# Patient Record
Sex: Male | Born: 1995 | Race: White | Hispanic: Yes | Marital: Single | Smoking: Current every day smoker
Health system: Southern US, Community
[De-identification: ages and names within clinical notes are randomized; demographics above are authoritative.]

## PROBLEM LIST (undated history)

## (undated) DIAGNOSIS — G43909 Migraine, unspecified, not intractable, without status migrainosus: Secondary | ICD-10-CM

## (undated) HISTORY — PX: OTHER SURGICAL HISTORY: SHX169

---
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
## 2017-01-28 ENCOUNTER — Emergency Department: Payer: Medicaid Other

## 2017-01-28 ENCOUNTER — Emergency Department
Admission: EM | Admit: 2017-01-28 | Discharge: 2017-01-28 | Disposition: A | Discharge status: Home / Self Care | Payer: Medicaid Other | Attending: Student in an Organized Health Care Education/Training Program | Admitting: Student in an Organized Health Care Education/Training Program

## 2017-01-28 DIAGNOSIS — R1032 Left lower quadrant pain: Secondary | ICD-10-CM | POA: Diagnosis present

## 2017-01-28 DIAGNOSIS — F17210 Nicotine dependence, cigarettes, uncomplicated: Secondary | ICD-10-CM | POA: Insufficient documentation

## 2017-01-28 DIAGNOSIS — N201 Calculus of ureter: Secondary | ICD-10-CM

## 2017-01-28 DIAGNOSIS — Z0389 Encounter for observation for other suspected diseases and conditions ruled out: Secondary | ICD-10-CM | POA: Insufficient documentation

## 2017-01-28 DIAGNOSIS — N50812 Left testicular pain: Secondary | ICD-10-CM

## 2017-01-28 LAB — CBC WITH DIFFERENTIAL/PLATELET
BASOS ABS: 0 10*3/uL (ref 0–0.1)
Basophils Relative: 1 %
Eosinophils Absolute: 0.1 10*3/uL (ref 0–0.7)
Eosinophils Relative: 1 %
HCT: 43.5 % (ref 40.0–52.0)
Hemoglobin: 15.3 g/dL (ref 13.0–18.0)
LYMPHS ABS: 1 10*3/uL (ref 1.0–3.6)
Lymphocytes Relative: 11 %
MCH: 31.6 pg (ref 26.0–34.0)
MCHC: 35.1 g/dL (ref 32.0–36.0)
MCV: 90.1 fL (ref 80.0–100.0)
MONOS PCT: 7 %
Monocytes Absolute: 0.6 10*3/uL (ref 0.2–1.0)
Neutro Abs: 7 10*3/uL — ABNORMAL HIGH (ref 1.4–6.5)
Neutrophils Relative %: 80 %
Platelets: 225 10*3/uL (ref 150–440)
RBC: 4.83 MIL/uL (ref 4.40–5.90)
RDW: 13.2 % (ref 11.5–14.5)
WBC: 8.7 10*3/uL (ref 3.8–10.6)

## 2017-01-28 LAB — URINE DRUG SCREEN, QUALITATIVE (ARMC ONLY)
Amphetamines, Ur Screen: NOT DETECTED
BARBITURATES, UR SCREEN: NOT DETECTED
BENZODIAZEPINE, UR SCRN: POSITIVE — AB
Cannabinoid 50 Ng, Ur ~~LOC~~: POSITIVE — AB
Cocaine Metabolite,Ur ~~LOC~~: NOT DETECTED
MDMA (Ecstasy)Ur Screen: NOT DETECTED
METHADONE SCREEN, URINE: NOT DETECTED
Opiate, Ur Screen: NOT DETECTED
Phencyclidine (PCP) Ur S: NOT DETECTED
TRICYCLIC, UR SCREEN: NOT DETECTED

## 2017-01-28 LAB — URINALYSIS, COMPLETE (UACMP) WITH MICROSCOPIC
BACTERIA UA: NONE SEEN
BILIRUBIN URINE: NEGATIVE
Glucose, UA: NEGATIVE mg/dL
KETONES UR: 20 mg/dL — AB
LEUKOCYTES UA: NEGATIVE
Nitrite: NEGATIVE
Protein, ur: 30 mg/dL — AB
SPECIFIC GRAVITY, URINE: 1.025 (ref 1.005–1.030)
SQUAMOUS EPITHELIAL / LPF: NONE SEEN
pH: 6 (ref 5.0–8.0)

## 2017-01-28 LAB — BASIC METABOLIC PANEL
ANION GAP: 8 (ref 5–15)
BUN: 11 mg/dL (ref 6–20)
CALCIUM: 8.4 mg/dL — AB (ref 8.9–10.3)
CO2: 25 mmol/L (ref 22–32)
Chloride: 108 mmol/L (ref 101–111)
Creatinine, Ser: 0.85 mg/dL (ref 0.61–1.24)
GFR calc Af Amer: >60 mL/min (ref >60)
GFR calc non Af Amer: >60 mL/min (ref >60)
GLUCOSE: 103 mg/dL — AB (ref 65–99)
Potassium: 3.6 mmol/L (ref 3.5–5.1)
Sodium: 141 mmol/L (ref 135–145)

## 2017-01-28 MED ORDER — MORPHINE SULFATE (PF) 4 MG/ML IV SOLN: 4.0000 mg | INTRAVENOUS | Status: DC | PRN

## 2017-01-28 MED ORDER — HYDROCODONE-ACETAMINOPHEN 5-325 MG PO TABS: 1.0000 | ORAL_TABLET | ORAL | 0 refills | Status: DC | PRN

## 2017-01-28 MED ORDER — HALOPERIDOL LACTATE 5 MG/ML IJ SOLN
5.0000 mg | Freq: Once | INTRAMUSCULAR | Status: AC
  Administered 2017-01-28: 5 mg via INTRAVENOUS
  Filled 2017-01-28: qty 1

## 2017-01-28 MED ORDER — NAPROXEN 500 MG PO TABS: 500.0000 mg | ORAL_TABLET | Freq: Two times a day (BID) | ORAL | 0 refills | Status: DC

## 2017-01-28 MED ORDER — PROMETHAZINE HCL 12.5 MG PO TABS: 12.5000 mg | ORAL_TABLET | Freq: Four times a day (QID) | ORAL | 0 refills | Status: DC | PRN

## 2017-01-28 MED ORDER — KETOROLAC TROMETHAMINE 30 MG/ML IJ SOLN
15.0000 mg | Freq: Once | INTRAMUSCULAR | Status: AC
  Administered 2017-01-28: 15 mg via INTRAVENOUS
  Filled 2017-01-28: qty 1

## 2017-01-28 MED ORDER — HYDROCODONE-ACETAMINOPHEN 5-325 MG PO TABS
1.0000 | ORAL_TABLET | Freq: Once | ORAL | Status: AC
  Administered 2017-01-28: 1 via ORAL
  Filled 2017-01-28: qty 1

## 2017-01-28 NOTE — ED Notes (Signed)
Pt unable to void at this time. 

## 2017-01-28 NOTE — ED Triage Notes (Addendum)
Pt arrived via ems for c/o groin/testicular pain - pt reports that he was asleep and awoke with severe pain in his left groin/testicle area - denies difficulty with urination or frequency

## 2017-01-28 NOTE — ED Notes (Signed)
Patient transported to Ultrasound 

## 2017-01-28 NOTE — ED Provider Notes (Signed)
[facility] Emergency Department Provider Note    First MD Initiated Contact with Patient 01/28/17 1233     (approximate)  I have reviewed the triage vital signs and the nursing notes.   HISTORY  Chief Complaint Groin Pain    HPI Mike Cochran is a 21 y.o. male severe left groin and testicular pain that awoke the patient from sleep. Rates the pain as 10 out of 10 in severity. Denies any dysuria or hematuria. He denies any trauma. Is not currently sexually active. No discharge.  Eyes any diarrhea or nausea or vomiting.   Past Medical History:  Diagnosis Date  . Migraine    No family history on file. Past Surgical History:  Procedure Laterality Date  . arm surgery     for fracture   There are no active problems to display for this patient.     Prior to Admission medications   Medication Sig Start Date End Date Taking? Authorizing Provider  HYDROcodone-acetaminophen (NORCO) 5-325 MG tablet Take 1 tablet by mouth every 4 (four) hours as needed for moderate pain. 01/28/17   Willy Eddy, MD  ibuprofen (ADVIL,MOTRIN) 800 MG tablet Take 1 tablet (800 mg total) by mouth every 8 (eight) hours as needed. Patient not taking: Reported on 01/28/2017 11/27/15   Evangeline Dakin, PA-C  naproxen (NAPROSYN) 500 MG tablet Take 1 tablet (500 mg total) by mouth 2 (two) times daily with a meal. 01/28/17 01/28/18  Willy Eddy, MD  promethazine (PHENERGAN) 12.5 MG tablet Take 1 tablet (12.5 mg total) by mouth every 6 (six) hours as needed for nausea or vomiting. 01/28/17   Willy Eddy, MD    Allergies Patient has no known allergies.    Social History Social History  Substance Use Topics  . Smoking status: Current Every Day Smoker    Packs/day: 0.25    Types: Cigarettes  . Smokeless tobacco: Never Used  . Alcohol use Yes    Review of Systems Patient denies headaches, rhinorrhea, blurry vision, numbness, shortness of breath, chest pain, edema,  cough, abdominal pain, nausea, vomiting, diarrhea, dysuria, fevers, rashes or hallucinations unless otherwise stated above in HPI. ____________________________________________   PHYSICAL EXAM:  VITAL SIGNS: Vitals:   01/28/17 1330 01/28/17 1405  BP: 114/60 104/66  Pulse: 61 (!) 52  Resp:    Temp:    SpO2: 99% 100%    Constitutional: Alert and oriented. Well appearing and in no acute distress. Eyes: Conjunctivae are normal.  Head: Atraumatic. Nose: No congestion/rhinnorhea. Mouth/Throat: Mucous membranes are moist.   Neck: No stridor. Painless ROM.  Cardiovascular: Normal rate, regular rhythm. Grossly normal heart sounds.  Good peripheral circulation. Respiratory: Normal respiratory effort.  No retractions. Lungs CTAB. Gastrointestinal: Soft and nontender. No distention. No abdominal bruits. No CVA tenderness. Genitourinary: no scrotal mass, or tenderness, no hernia, + cremasteric reflex bilaterally Musculoskeletal: No lower extremity tenderness nor edema.  No joint effusions. Neurologic:  Normal speech and language. No gross focal neurologic deficits are appreciated. No facial droop Skin:  Skin is warm, dry and intact. No rash noted. Psychiatric: Mood and affect are normal. Speech and behavior are normal.  ____________________________________________   LABS (all labs ordered are listed, but only abnormal results are displayed)  Results for orders placed or performed during the hospital encounter of 01/28/17 (from the past 24 hour(s))  CBC with Differential/Platelet     Status: Abnormal   Collection Time: 01/28/17 12:55 PM  Result Value Ref Range   WBC 8.7  3.8 - 10.6 K/uL   RBC 4.83 4.40 - 5.90 MIL/uL   Hemoglobin 15.3 13.0 - 18.0 g/dL   HCT 45.4 09.8 - 11.9 %   MCV 90.1 80.0 - 100.0 fL   MCH 31.6 26.0 - 34.0 pg   MCHC 35.1 32.0 - 36.0 g/dL   RDW 14.7 82.9 - 56.2 %   Platelets 225 150 - 440 K/uL   Neutrophils Relative % 80 %   Neutro Abs 7.0 (H) 1.4 - 6.5 K/uL    Lymphocytes Relative 11 %   Lymphs Abs 1.0 1.0 - 3.6 K/uL   Monocytes Relative 7 %   Monocytes Absolute 0.6 0.2 - 1.0 K/uL   Eosinophils Relative 1 %   Eosinophils Absolute 0.1 0 - 0.7 K/uL   Basophils Relative 1 %   Basophils Absolute 0.0 0 - 0.1 K/uL  Basic metabolic panel     Status: Abnormal   Collection Time: 01/28/17 12:55 PM  Result Value Ref Range   Sodium 141 135 - 145 mmol/L   Potassium 3.6 3.5 - 5.1 mmol/L   Chloride 108 101 - 111 mmol/L   CO2 25 22 - 32 mmol/L   Glucose, Bld 103 (H) 65 - 99 mg/dL   BUN 11 6 - 20 mg/dL   Creatinine, Ser 1.30 0.61 - 1.24 mg/dL   Calcium 8.4 (L) 8.9 - 10.3 mg/dL   GFR calc non Af Amer >60 >60 mL/min   GFR calc Af Amer >60 >60 mL/min   Anion gap 8 5 - 15  Urinalysis, Complete w Microscopic     Status: Abnormal   Collection Time: 01/28/17  2:40 PM  Result Value Ref Range   Color, Urine AMBER (A) YELLOW   APPearance CLEAR (A) CLEAR   Specific Gravity, Urine 1.025 1.005 - 1.030   pH 6.0 5.0 - 8.0   Glucose, UA NEGATIVE NEGATIVE mg/dL   Hgb urine dipstick LARGE (A) NEGATIVE   Bilirubin Urine NEGATIVE NEGATIVE   Ketones, ur 20 (A) NEGATIVE mg/dL   Protein, ur 30 (A) NEGATIVE mg/dL   Nitrite NEGATIVE NEGATIVE   Leukocytes, UA NEGATIVE NEGATIVE   RBC / HPF TOO NUMEROUS TO COUNT 0 - 5 RBC/hpf   WBC, UA 6-30 0 - 5 WBC/hpf   Bacteria, UA NONE SEEN NONE SEEN   Squamous Epithelial / LPF NONE SEEN NONE SEEN   Mucus PRESENT    ____________________________________________ ____________________________________________  RADIOLOGY  I personally reviewed all radiographic images ordered to evaluate for the above acute complaints and reviewed radiology reports and findings.  These findings were personally discussed with the patient.  Please see medical record for radiology report.  ____________________________________________   PROCEDURES  Procedure(s) performed:  Procedures    Critical Care performed:  no ____________________________________________   INITIAL IMPRESSION / ASSESSMENT AND PLAN / ED COURSE  Pertinent labs & imaging results that were available during my care of the patient were reviewed by me and considered in my medical decision making (see chart for details).  DDX: torsion, epididymitis, stone, uti, hernia  Mike Cochran is a 21 y.o. who presents to the ED with left groin pain as described above. Ultrasound ordered due to concern for epididymitis versus torsion though clinically seemed less consistent with this. Ultrasound shows good blood flow bilaterally with normal findings therefore his CT imaging ordered for the above differential shows evidence of nonobstructing stone. Patient does have hematuria without infection. No leukocytosis and renal function is normal.  Clinical Course as of Jan 29 1551  Tue Jan 28, 2017  1517 urinalysis does show large blood and no infection. Presentation most clinically consistent with ureterolithiasis. No evidence of torsion or epididymitis. No evidence of perforation. We'll give IV Toradol and drove oral pain medication.  [PR]  1546 patient reassessed. Resting comfortably. Repeat abdominal exam is soft and benign. This point if the patient is stable for outpatient follow-up. Will be given referral to urology.  [PR]    Clinical Course User Index [PR] Willy Eddyobinson, Julianne Chamberlin, MD     ____________________________________________   FINAL CLINICAL IMPRESSION(S) / ED DIAGNOSES  Final diagnoses:  Testicular pain, left  Left groin pain  Ureterolithiasis      NEW MEDICATIONS STARTED DURING THIS VISIT:  New Prescriptions   HYDROCODONE-ACETAMINOPHEN (NORCO) 5-325 MG TABLET    Take 1 tablet by mouth every 4 (four) hours as needed for moderate pain.   NAPROXEN (NAPROSYN) 500 MG TABLET    Take 1 tablet (500 mg total) by mouth 2 (two) times daily with a meal.   PROMETHAZINE (PHENERGAN) 12.5 MG TABLET    Take 1 tablet (12.5 mg total) by mouth  every 6 (six) hours as needed for nausea or vomiting.     Note:  This document was prepared using Dragon voice recognition software and may include unintentional dictation errors.    Willy Eddyobinson, Shaniah Baltes, MD 01/28/17 [phone]

## 2017-01-28 NOTE — ED Notes (Addendum)
Pt appears anxious and pressured speech - he is hesitant to be discharged due to the pain and not knowing where to go for f/u - advised pt that this nurse would review discharge instructions in detail with him prior to discharge and that pain would be controlled at time of discharge

## 2017-01-28 NOTE — Discharge Instructions (Signed)

## 2017-10-29 ENCOUNTER — Other Ambulatory Visit: Payer: Self-pay

## 2017-10-29 ENCOUNTER — Emergency Department
Admission: EM | Admit: 2017-10-29 | Discharge: 2017-10-29 | Disposition: A | Discharge status: Other Acute Inpatient Hospital | Payer: Medicaid Other | Attending: Emergency Medicine | Admitting: Emergency Medicine

## 2017-10-29 ENCOUNTER — Emergency Department: Payer: Medicaid Other

## 2017-10-29 ENCOUNTER — Encounter: Payer: Self-pay | Admitting: Psychiatry

## 2017-10-29 ENCOUNTER — Inpatient Hospital Stay
Admission: AD | Admit: 2017-10-29 | Discharge: 2017-10-31 | DRG: 885 | Disposition: A | Discharge status: Home / Self Care | Payer: Medicaid Other | Attending: Psychiatry | Admitting: Psychiatry

## 2017-10-29 DIAGNOSIS — Z818 Family history of other mental and behavioral disorders: Secondary | ICD-10-CM | POA: Diagnosis not present

## 2017-10-29 DIAGNOSIS — S71102A Unspecified open wound, left thigh, initial encounter: Secondary | ICD-10-CM | POA: Diagnosis present

## 2017-10-29 DIAGNOSIS — Z7289 Other problems related to lifestyle: Secondary | ICD-10-CM | POA: Insufficient documentation

## 2017-10-29 DIAGNOSIS — Y92410 Unspecified street and highway as the place of occurrence of the external cause: Secondary | ICD-10-CM

## 2017-10-29 DIAGNOSIS — F1220 Cannabis dependence, uncomplicated: Secondary | ICD-10-CM

## 2017-10-29 DIAGNOSIS — Y901 Blood alcohol level of 20-39 mg/100 ml: Secondary | ICD-10-CM | POA: Insufficient documentation

## 2017-10-29 DIAGNOSIS — Y929 Unspecified place or not applicable: Secondary | ICD-10-CM | POA: Diagnosis not present

## 2017-10-29 DIAGNOSIS — X749XXA Intentional self-harm by unspecified firearm discharge, initial encounter: Secondary | ICD-10-CM

## 2017-10-29 DIAGNOSIS — F10920 Alcohol use, unspecified with intoxication, uncomplicated: Secondary | ICD-10-CM | POA: Insufficient documentation

## 2017-10-29 DIAGNOSIS — Y999 Unspecified external cause status: Secondary | ICD-10-CM | POA: Insufficient documentation

## 2017-10-29 DIAGNOSIS — Z915 Personal history of self-harm: Secondary | ICD-10-CM | POA: Diagnosis not present

## 2017-10-29 DIAGNOSIS — Z046 Encounter for general psychiatric examination, requested by authority: Secondary | ICD-10-CM | POA: Diagnosis not present

## 2017-10-29 DIAGNOSIS — F17210 Nicotine dependence, cigarettes, uncomplicated: Secondary | ICD-10-CM | POA: Diagnosis not present

## 2017-10-29 DIAGNOSIS — Z733 Stress, not elsewhere classified: Secondary | ICD-10-CM | POA: Insufficient documentation

## 2017-10-29 DIAGNOSIS — F332 Major depressive disorder, recurrent severe without psychotic features: Principal | ICD-10-CM | POA: Diagnosis present

## 2017-10-29 DIAGNOSIS — F17200 Nicotine dependence, unspecified, uncomplicated: Secondary | ICD-10-CM | POA: Diagnosis present

## 2017-10-29 DIAGNOSIS — R1110 Vomiting, unspecified: Secondary | ICD-10-CM | POA: Insufficient documentation

## 2017-10-29 DIAGNOSIS — X72XXXA Intentional self-harm by handgun discharge, initial encounter: Secondary | ICD-10-CM | POA: Insufficient documentation

## 2017-10-29 DIAGNOSIS — F329 Major depressive disorder, single episode, unspecified: Secondary | ICD-10-CM | POA: Diagnosis not present

## 2017-10-29 DIAGNOSIS — Z23 Encounter for immunization: Secondary | ICD-10-CM | POA: Insufficient documentation

## 2017-10-29 DIAGNOSIS — Y249XXA Unspecified firearm discharge, undetermined intent, initial encounter: Secondary | ICD-10-CM

## 2017-10-29 DIAGNOSIS — Y9389 Activity, other specified: Secondary | ICD-10-CM | POA: Diagnosis not present

## 2017-10-29 DIAGNOSIS — Z9189 Other specified personal risk factors, not elsewhere classified: Secondary | ICD-10-CM | POA: Diagnosis not present

## 2017-10-29 DIAGNOSIS — S71132A Puncture wound without foreign body, left thigh, initial encounter: Secondary | ICD-10-CM | POA: Insufficient documentation

## 2017-10-29 LAB — CBC WITH DIFFERENTIAL/PLATELET
Basophils Absolute: 0.1 10*3/uL (ref 0–0.1)
Basophils Relative: 0 %
EOS ABS: 0.1 10*3/uL (ref 0–0.7)
EOS PCT: 1 %
HCT: 45.8 % (ref 40.0–52.0)
HEMOGLOBIN: 16.1 g/dL (ref 13.0–18.0)
LYMPHS ABS: 4.5 10*3/uL — AB (ref 1.0–3.6)
Lymphocytes Relative: 31 %
MCH: 32.4 pg (ref 26.0–34.0)
MCHC: 35 g/dL (ref 32.0–36.0)
MCV: 92.3 fL (ref 80.0–100.0)
Monocytes Absolute: 1.2 10*3/uL — ABNORMAL HIGH (ref 0.2–1.0)
Monocytes Relative: 8 %
Neutro Abs: 8.4 10*3/uL — ABNORMAL HIGH (ref 1.4–6.5)
Neutrophils Relative %: 60 %
PLATELETS: 332 10*3/uL (ref 150–440)
RBC: 4.96 MIL/uL (ref 4.40–5.90)
RDW: 13.7 % (ref 11.5–14.5)
WBC: 14.2 10*3/uL — AB (ref 3.8–10.6)

## 2017-10-29 LAB — BASIC METABOLIC PANEL
Anion gap: 15 (ref 5–15)
BUN: 15 mg/dL (ref 6–20)
CHLORIDE: 103 mmol/L (ref 101–111)
CO2: 22 mmol/L (ref 22–32)
Calcium: 9.6 mg/dL (ref 8.9–10.3)
Creatinine, Ser: 0.99 mg/dL (ref 0.61–1.24)
GFR calc Af Amer: >60 mL/min (ref >60)
GFR calc non Af Amer: >60 mL/min (ref >60)
GLUCOSE: 124 mg/dL — AB (ref 65–99)
POTASSIUM: 3.1 mmol/L — AB (ref 3.5–5.1)
SODIUM: 140 mmol/L (ref 135–145)

## 2017-10-29 LAB — CK: CK TOTAL: 59 U/L (ref 49–397)

## 2017-10-29 LAB — ETHANOL: ALCOHOL ETHYL (B): 35 mg/dL — AB (ref <10)

## 2017-10-29 LAB — URINE DRUG SCREEN, QUALITATIVE (ARMC ONLY)
AMPHETAMINES, UR SCREEN: NOT DETECTED
Barbiturates, Ur Screen: NOT DETECTED
Benzodiazepine, Ur Scrn: NOT DETECTED
CANNABINOID 50 NG, UR ~~LOC~~: POSITIVE — AB
Cocaine Metabolite,Ur ~~LOC~~: NOT DETECTED
MDMA (ECSTASY) UR SCREEN: NOT DETECTED
Methadone Scn, Ur: NOT DETECTED
OPIATE, UR SCREEN: NOT DETECTED
PHENCYCLIDINE (PCP) UR S: NOT DETECTED
Tricyclic, Ur Screen: NOT DETECTED

## 2017-10-29 LAB — TYPE AND SCREEN
ABO/RH(D): O POS
Antibody Screen: NEGATIVE

## 2017-10-29 LAB — GLUCOSE, CAPILLARY: GLUCOSE-CAPILLARY: 126 mg/dL — AB (ref 65–99)

## 2017-10-29 MED ORDER — FLUOXETINE HCL 20 MG PO CAPS
20.0000 mg | ORAL_CAPSULE | Freq: Every day | ORAL | Status: DC
  Administered 2017-10-30 – 2017-10-31 (×2): 20 mg via ORAL
  Filled 2017-10-29 (×2): qty 1

## 2017-10-29 MED ORDER — ASPIRIN EC 81 MG PO TBEC: 81.0000 mg | DELAYED_RELEASE_TABLET | Freq: Every day | ORAL | Status: DC

## 2017-10-29 MED ORDER — ACETAMINOPHEN 500 MG PO TABS: 500.0000 mg | ORAL_TABLET | ORAL | Status: DC | PRN

## 2017-10-29 MED ORDER — OXYCODONE-ACETAMINOPHEN 5-325 MG PO TABS
1.0000 | ORAL_TABLET | Freq: Four times a day (QID) | ORAL | Status: DC | PRN
  Administered 2017-10-29 – 2017-10-30 (×3): 1 via ORAL
  Filled 2017-10-29 (×3): qty 1

## 2017-10-29 MED ORDER — SODIUM CHLORIDE 0.9 % IV BOLUS
1000.0000 mL | Freq: Once | INTRAVENOUS | Status: AC
  Administered 2017-10-29: 1000 mL via INTRAVENOUS

## 2017-10-29 MED ORDER — CEPHALEXIN 500 MG PO CAPS: 500.0000 mg | ORAL_CAPSULE | Freq: Three times a day (TID) | ORAL | 0 refills | Status: DC

## 2017-10-29 MED ORDER — CEFAZOLIN SODIUM-DEXTROSE 1-4 GM/50ML-% IV SOLN
1.0000 g | Freq: Once | INTRAVENOUS | Status: AC
  Administered 2017-10-29: 1 g via INTRAVENOUS
  Filled 2017-10-29: qty 50

## 2017-10-29 MED ORDER — IOPAMIDOL (ISOVUE-370) INJECTION 76%
125.0000 mL | Freq: Once | INTRAVENOUS | Status: AC | PRN
  Administered 2017-10-29: 125 mL via INTRAVENOUS

## 2017-10-29 MED ORDER — ONDANSETRON HCL 4 MG/2ML IJ SOLN
INTRAMUSCULAR | Status: AC
  Filled 2017-10-29: qty 2

## 2017-10-29 MED ORDER — HYDROXYZINE HCL 50 MG PO TABS: 50.0000 mg | ORAL_TABLET | Freq: Three times a day (TID) | ORAL | Status: DC | PRN

## 2017-10-29 MED ORDER — TETANUS-DIPHTH-ACELL PERTUSSIS 5-2.5-18.5 LF-MCG/0.5 IM SUSP
0.5000 mL | Freq: Once | INTRAMUSCULAR | Status: AC
  Administered 2017-10-29: 0.5 mL via INTRAMUSCULAR
  Filled 2017-10-29: qty 0.5

## 2017-10-29 MED ORDER — CEPHALEXIN 500 MG PO CAPS
500.0000 mg | ORAL_CAPSULE | Freq: Three times a day (TID) | ORAL | Status: DC
  Administered 2017-10-29 (×2): 500 mg via ORAL
  Filled 2017-10-29 (×2): qty 1

## 2017-10-29 MED ORDER — ONDANSETRON HCL 4 MG/2ML IJ SOLN
4.0000 mg | Freq: Once | INTRAMUSCULAR | Status: AC
  Administered 2017-10-29: 4 mg via INTRAVENOUS
  Filled 2017-10-29: qty 2

## 2017-10-29 MED ORDER — ACETAMINOPHEN 325 MG PO TABS: 650.0000 mg | ORAL_TABLET | Freq: Four times a day (QID) | ORAL | Status: DC | PRN

## 2017-10-29 MED ORDER — FLUOXETINE HCL 20 MG PO CAPS
20.0000 mg | ORAL_CAPSULE | Freq: Every day | ORAL | Status: DC
  Administered 2017-10-29: 20 mg via ORAL
  Filled 2017-10-29: qty 1

## 2017-10-29 MED ORDER — CEPHALEXIN 500 MG PO CAPS
500.0000 mg | ORAL_CAPSULE | Freq: Three times a day (TID) | ORAL | Status: AC
  Administered 2017-10-29 – 2017-10-31 (×5): 500 mg via ORAL
  Filled 2017-10-29 (×5): qty 1

## 2017-10-29 MED ORDER — ALUM & MAG HYDROXIDE-SIMETH 200-200-20 MG/5ML PO SUSP: 30.0000 mL | ORAL | Status: DC | PRN

## 2017-10-29 MED ORDER — TRAZODONE HCL 100 MG PO TABS
100.0000 mg | ORAL_TABLET | Freq: Every evening | ORAL | Status: DC | PRN
  Administered 2017-10-30: 100 mg via ORAL
  Filled 2017-10-29: qty 1

## 2017-10-29 MED ORDER — OXYCODONE-ACETAMINOPHEN 5-325 MG PO TABS
1.0000 | ORAL_TABLET | Freq: Once | ORAL | Status: AC
  Administered 2017-10-29: 1 via ORAL
  Filled 2017-10-29: qty 1

## 2017-10-29 MED ORDER — MAGNESIUM HYDROXIDE 400 MG/5ML PO SUSP: 30.0000 mL | Freq: Every day | ORAL | Status: DC | PRN

## 2017-10-29 MED ORDER — NICOTINE 21 MG/24HR TD PT24
21.0000 mg | MEDICATED_PATCH | Freq: Every day | TRANSDERMAL | Status: DC
  Filled 2017-10-29: qty 1
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
## 2017-10-29 NOTE — Discharge Instructions (Addendum)
1.  Take antibiotic as prescribed (Keflex 500 mg 3 times daily for 7 days). 2.  Take a baby aspirin daily. 3.  Return to the ER for worsening symptoms, increased redness/swelling, purulent discharge from wound, fevers, thoughts of hurting yourself or others, or other concerns.

## 2017-10-29 NOTE — ED Notes (Addendum)
Pt's sister called to talk with patient at 1pm during phone hours.   Social work currently at bedside.

## 2017-10-29 NOTE — ED Notes (Signed)
Iver Nestleenny Seagroves (Mother) [phone]

## 2017-10-29 NOTE — BH Assessment (Signed)
[facility] by Dr. Toni Amendlapacs.  Attending Physician will be Dr. Jennet MaduroPucilowska.   Patient has been assigned to room 311, [facility] Nurse Lillette BoxerGwen F.   ER staff is aware of the admission:  Emilie, ER Sectary   Dr. Lenard LancePaduchowski, ER MD   Mertie ClauseJeanelle, Patient Access.
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
## 2017-10-29 NOTE — ED Notes (Signed)
Pt reports thoughts of hopelessness. States that he knows he needs help and that he needs to move out of the environment he is in and surround himself with different people. Reports a lot of stress in his life re: living situation, choice in friends and his baby on the way. States he can only ever talk to his "baby mama" about his problems and has never talked to a therapist. States that he is willing.  Pt would like to speak with social work re: his options.

## 2017-10-29 NOTE — [facility] Group Notes (Signed)
[facility] Group Notes:  (Nursing/MHT/Case Management/Adjunct)  Date:  10/29/2017  Time:  9:36 PM  Type of Therapy:  Group Therapy  Participation Level:  Active  Participation Quality:  Appropriate  Affect:  Appropriate  Cognitive:  Appropriate  Insight:  Appropriate  Engagement in Group:  Engaged  Modes of Intervention:  Discussion  Summary of Progress/Problems:  Mike Cochran Mike Cochran Mike Cochran 10/29/2017, 9:36 PM

## 2017-10-29 NOTE — ED Notes (Signed)
TTS at bedside. 

## 2017-10-29 NOTE — Clinical Social Work Note (Signed)
CSW received consult for "Pt requesting to speak with someone regarding his options going forward. Would like to move out of his current living situation." CSW met with patient at bedside. Patient and CSW discussed concerns about several challenges in patient's home environment including homelessness, lack of consistent work/finances, untreated mental health needs, lack of transportation, and his girlfriend is expecting a child. Patient assessed by psychiatrist-Dr. Weber Cooks and recommended for inpatient psych admission. CSW completed handoff. CSW signing off.   Oretha Ellis, Latanya Presser, [place] Worker-Emergency Department [phone]
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
## 2017-10-30 DIAGNOSIS — F332 Major depressive disorder, recurrent severe without psychotic features: Principal | ICD-10-CM

## 2017-10-30 LAB — LIPID PANEL
CHOLESTEROL: 146 mg/dL (ref 0–200)
HDL: 44 mg/dL (ref >40)
LDL Cholesterol: 81 mg/dL (ref 0–99)
TRIGLYCERIDES: 105 mg/dL (ref <150)
Total CHOL/HDL Ratio: 3.3 RATIO
VLDL: 21 mg/dL (ref 0–40)

## 2017-10-30 LAB — HEMOGLOBIN A1C
Hgb A1c MFr Bld: 4.9 % (ref 4.8–5.6)
MEAN PLASMA GLUCOSE: 93.93 mg/dL

## 2017-10-30 LAB — TSH: TSH: 3.491 u[IU]/mL (ref 0.350–4.500)

## 2017-10-30 MED ORDER — CEPHALEXIN 500 MG PO CAPS: 500.0000 mg | ORAL_CAPSULE | Freq: Three times a day (TID) | ORAL | 0 refills | Status: AC

## 2017-10-30 MED ORDER — TRAMADOL HCL 50 MG PO TABS
50.0000 mg | ORAL_TABLET | Freq: Four times a day (QID) | ORAL | Status: DC | PRN
  Administered 2017-10-30 – 2017-10-31 (×2): 50 mg via ORAL
  Filled 2017-10-30 (×2): qty 1

## 2017-10-30 MED ORDER — HYDROXYZINE HCL 50 MG PO TABS: 50.0000 mg | ORAL_TABLET | Freq: Three times a day (TID) | ORAL | 1 refills | Status: AC | PRN

## 2017-10-30 MED ORDER — IBUPROFEN 800 MG PO TABS: 800.0000 mg | ORAL_TABLET | Freq: Three times a day (TID) | ORAL | 0 refills | Status: AC | PRN

## 2017-10-30 MED ORDER — FLUOXETINE HCL 20 MG PO CAPS: 20.0000 mg | ORAL_CAPSULE | Freq: Every day | ORAL | 1 refills | Status: AC

## 2017-10-30 MED ORDER — TRAZODONE HCL 100 MG PO TABS: 100.0000 mg | ORAL_TABLET | Freq: Every evening | ORAL | 1 refills | Status: AC | PRN
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
## 2017-10-30 NOTE — BHH Group Notes (Signed)
  10/30/2017  Time: 1PM  Type of Therapy/Topic:  Group Therapy:  Balance in Life  Participation Level:  None  Description of Group:   This group will address the concept of balance and how it feels and looks when one is unbalanced. Patients will be encouraged to process areas in their lives that are out of balance and identify reasons for remaining unbalanced. Facilitators will guide patients in utilizing problem-solving interventions to address and correct the stressor making their life unbalanced. Understanding and applying boundaries will be explored and addressed for obtaining and maintaining a balanced life. Patients will be encouraged to explore ways to assertively make their unbalanced needs known to significant others in their lives, using other group members and facilitator for support and feedback.  Therapeutic Goals: 1. Patient will identify two or more emotions or situations they have that consume much of in their lives. 2. Patient will identify signs/triggers that life has become out of balance:  3. Patient will identify two ways to set boundaries in order to achieve balance in their lives:  4. Patient will demonstrate ability to communicate their needs through discussion and/or role plays  Summary of Patient Progress: Pt attended group but did not participate. When prompted by CSW, pt would state, "what was the question again?" At the end of group, pt became agitated and asked CSW to explain the group topic and discussion again. CSW explained the topic briefly, but pt did not seem satisfied with explanation.     Therapeutic Modalities:   Cognitive Behavioral Therapy Solution-Focused Therapy Assertiveness Training  Heidi DachKelsey Anelise Staron, MSW, LCSW Clinical Social Worker 10/30/2017 2:00 PM

## 2017-10-30 NOTE — Progress Notes (Signed)
Recreation Therapy Notes  INPATIENT RECREATION THERAPY ASSESSMENT  Patient Details Name: Hilliard Clarkrevor O Sultan MRN: [id number] DOB: 07/20/1995 Today's Date: 10/30/2017       Information Obtained From: Patient  Able to Participate in Assessment/Interview: Yes  Patient Presentation: Responsive  Reason for Admission (Per Patient): Self-injurious Behavior  Patient Stressors:    Coping Skills:   Music, Other (Comment)(Kisses and Hugs)  Leisure Interests (2+):  Music - Listen(Park, Quiet time)  Frequency of Recreation/Participation: Monthly  Awareness of Community Resources:  Yes  Community Resources:  Park  Current Use: Yes  If no, Barriers?:    Expressed Interest in [employer] Street CorporationCommunity Resource Information:    [place]  Patient Main Form of Transportation: Uber/Lyft  Patient Strengths:  Understanding  Patient Identified Areas of Improvement:  Improving coping skills  Patient Goal for Hospitalization:  Stay Relaxed  Current SI (including self-harm):  No  Current HI:  No  Current AVH: No  Staff Intervention Plan: Group Attendance, Collaborate with Interdisciplinary Treatment Team  Consent to Intern Participation: N/A  Mareli Antunes 10/30/2017, 3:46 PM

## 2017-10-30 NOTE — Progress Notes (Signed)
Recreation Therapy Notes  Date: 10/30/2017  Time: 9:30 am  Location: Room 21  Behavioral response: Appropriate  Intervention Topic: Animal Assisted Therapy  Discussion/Intervention:  Patient participated in Animal Assisted Therapy during group today. Group facilitator defined Animal Assisted Therapy as the use of animals as a therapeutic tool to assist a person in restoring balance to their life.  The group facilitator also described the benefits of Animal Assisted Therapy as improving patients' mental, physical, social and emotional functioning with the aid of animals; depending on the needs of the patient. Individuals in the group were able to pet the dogs as well as ask questions.  Clinical Observations/Feedback:  Patient came to group and was focused on what peers and staff had to say about the topic at hand. Individual was social with peers and staff while stay on topic during group. Participant was engaged with the dogs during group. Patient came to group late due to being with Child psychotherapistsocial worker. Mike Cochran LRT/CTRS         Mike Cochran 10/30/2017 11:01 AM

## 2017-10-30 NOTE — [facility] Group Notes (Signed)
LCSW Group Therapy Note 10/30/2017 9:00AM  Type of Therapy and Topic:  Group Therapy:  Setting Goals  Participation Level:  Minimal  Description of Group: In this process group, patients discussed using strengths to work toward goals and address challenges.  Patients identified two positive things about themselves and one goal they were working on.  Patients were given the opportunity to share openly and support each other's plan for self-empowerment.  The group discussed the value of gratitude and were encouraged to have a daily reflection of positive characteristics or circumstances.  Patients were encouraged to identify a plan to utilize their strengths to work on current challenges and goals.  Therapeutic Goals 1. Patient will verbalize personal strengths/positive qualities and relate how these can assist with achieving desired personal goals 2. Patients will verbalize affirmation of peers plans for personal change and goal setting 3. Patients will explore the value of gratitude and positive focus as related to successful achievement of goals 4. Patients will verbalize a plan for regular reinforcement of personal positive qualities and circumstances.  Summary of Patient Progress: Beryle Beamsrevor was able to participate some in today's group discussion on setting goals using the SMART Model.  He appeared to have a good understanding of the SMART Model.  Beryle Beamsrevor had to leave group early due to being called out by his RN.  Beryle Beamsrevor was unable to share with the goal one goal that he would like to start working on while in the hospital or a goal that he would like to work on when he is discharged from the hospital.      Therapeutic Modalities Cognitive Behavioral Therapy Motivational Interviewing    Alease FrameSonya S Makynzie Dobesh, [place] 10/30/2017 11:30 AM

## 2017-10-30 NOTE — Progress Notes (Signed)
@  0930 provided patient with u/a cup. Patient stated "I don't have to take a leak yet." Will await specimen collection.

## 2017-10-30 NOTE — Progress Notes (Signed)
Met with wound nurse at patient bedside approx. 0915. Patient wound was assessed, and a new clean bandage was applied. Wound care nurse to follow up with patient care orders for [facility] nurses to follow. This nurse was provided with algisite supply for wound care. Patient had no complaints and tolerated treatment well. Will continue to monitor and assess wound.
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
## 2017-10-31 LAB — URINE CULTURE: CULTURE: NO GROWTH

## 2017-10-31 MED ORDER — ONDANSETRON 4 MG PO TBDP
4.0000 mg | ORAL_TABLET | Freq: Three times a day (TID) | ORAL | Status: DC | PRN
  Administered 2017-10-31: 4 mg via ORAL
  Filled 2017-10-31: qty 1

## 2017-10-31 NOTE — Plan of Care (Signed)
Pt cooperative this evening. Pt denies SI/HI. Pt did not feel like the tramadol 50 mg would help with his pain but after taking, he had no complaints. Pt is receptive to treatment and safety maintained on unit. Pt is receptive to treatment and safety maintained on unit. Will continue to monitor.  Problem: Education: Goal: Knowledge of  General Education information/materials will improve Outcome: Progressing   Problem: Coping: Goal: Ability to verbalize frustrations and anger appropriately will improve Outcome: Progressing   Problem: Safety: Goal: Ability to disclose and discuss suicidal ideas will improve Outcome: Progressing   Problem: Self-Concept: Goal: Will verbalize positive feelings about self Outcome: Progressing Goal: Level of anxiety will decrease Outcome: Progressing

## 2017-10-31 NOTE — Progress Notes (Signed)
  [facility] :  Will you be returning to the same living situation after discharge:  No. At discharge, do you have transportation home?: Yes,  A family friend will be picking pt up at discharge. Do you have the ability to pay for your medications: Yes,  Pt has health insurance and financial means to pay for his medications.  Release of information consent forms completed and in the chart;  Patient's signature needed at discharge.  Patient to Follow up at: Follow-up Information    [facility] Follow up.   Why:  Intake appointments are Monday, Wednesday, Friday from 8:00AM to 3:00PM.  Please go for follow-up intake within the next 7 days. Thank you. Contact information: [address] [place] [address] [phone]        [facility] on 11/03/2017.   Specialty:  General Practice Why:  You have a physical health follow-up appointment on Monday, November 03, 2017 at 9:20AM.  Please bring a copy of your medications and insurance card. Contact information: [address] [place] [address] [phone]           Next level of care provider has access to [facility] Link:no  Safety Planning and Suicide Prevention discussed: Yes,  Pt shared that he does not have a gun or weapons in his home.  Have you used any form of tobacco in the last 30 days? (Cigarettes, Smokeless Tobacco, Cigars, and/or Pipes): Yes  Has patient been referred to the Quitline?: Patient refused referral  Patient has been referred for addiction treatment: Pt. refused referral  Alease FrameSonya S Deon Ivey, LCSW 10/31/2017, 10:14 AM

## 2017-10-31 NOTE — Progress Notes (Signed)
Patient was provided with discharge summary, Transition packet, and Suicide Risk Assessment. Verbalized discharge summary, transition packet and suicide risk assessment, patient made aware of any change in medications, and all upcoming appointments.   Patient belongings/money returned.  Rx provided to patient.  Patient denied any SI, denies plans for self harm or the harm of others. Patient is calm, with appropriate affect and eye contact. Patient reports no complaints at this time.   Patient will be discharged to self/friend @ 1138.
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
## 2017-10-31 NOTE — Progress Notes (Signed)
D: Patient denies SI/HI/AVH. Patient is calm, cooperative and pleasant. Patient is seen in milieu interacting with peers. Patient has no complaints at this time. Patient is preparing for discharge today. Patient has had wound care complete and tolerated treatment well.  A: Patient was assessed by this nurse. Patient was oriented to unit. Patient's safety was maintained on unit. Q x 15 minute observation checks were completed for safety. Patient care plan was reviewed. Patient was offered support and encouragement. Patient was encourage to attend groups, participate in unit activities and continue with plan of care.   R: Patient has no complaints of pain at this time. Patient is receptive to treatment and safety maintained on unit.

## 2019-07-19 DEATH — deceased
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
